# Patient Record
Sex: Female | Born: 1970 | Race: Black or African American | Hispanic: No | Marital: Single
Health system: Midwestern US, Community
[De-identification: ages and names within clinical notes are randomized; demographics above are authoritative.]

## PROBLEM LIST (undated history)

## (undated) DIAGNOSIS — E119 Type 2 diabetes mellitus without complications: Secondary | ICD-10-CM

## (undated) DIAGNOSIS — F32A Depression, unspecified: Secondary | ICD-10-CM

## (undated) DIAGNOSIS — I10 Essential (primary) hypertension: Secondary | ICD-10-CM

## (undated) DIAGNOSIS — F329 Major depressive disorder, single episode, unspecified: Secondary | ICD-10-CM

## (undated) DIAGNOSIS — F419 Anxiety disorder, unspecified: Secondary | ICD-10-CM

## (undated) HISTORY — PX: TONSILLECTOMY: SUR1361

---

## 2016-03-13 ENCOUNTER — Emergency Department (HOSPITAL_BASED_OUTPATIENT_CLINIC_OR_DEPARTMENT_OTHER)
Admission: EM | Admit: 2016-03-13 | Discharge: 2016-03-13 | Disposition: A | Discharge status: Home / Self Care | Payer: Medicaid - Out of State | Attending: Emergency Medicine | Admitting: Emergency Medicine

## 2016-03-13 ENCOUNTER — Emergency Department (HOSPITAL_BASED_OUTPATIENT_CLINIC_OR_DEPARTMENT_OTHER): Payer: Medicaid - Out of State

## 2016-03-13 ENCOUNTER — Encounter (HOSPITAL_BASED_OUTPATIENT_CLINIC_OR_DEPARTMENT_OTHER): Payer: Self-pay | Admitting: Emergency Medicine

## 2016-03-13 DIAGNOSIS — Y9389 Activity, other specified: Secondary | ICD-10-CM | POA: Insufficient documentation

## 2016-03-13 DIAGNOSIS — Z79899 Other long term (current) drug therapy: Secondary | ICD-10-CM | POA: Insufficient documentation

## 2016-03-13 DIAGNOSIS — Z7982 Long term (current) use of aspirin: Secondary | ICD-10-CM | POA: Diagnosis not present

## 2016-03-13 DIAGNOSIS — S8992XA Unspecified injury of left lower leg, initial encounter: Secondary | ICD-10-CM | POA: Diagnosis present

## 2016-03-13 DIAGNOSIS — Y998 Other external cause status: Secondary | ICD-10-CM | POA: Diagnosis not present

## 2016-03-13 DIAGNOSIS — Z7984 Long term (current) use of oral hypoglycemic drugs: Secondary | ICD-10-CM | POA: Insufficient documentation

## 2016-03-13 DIAGNOSIS — S3992XA Unspecified injury of lower back, initial encounter: Secondary | ICD-10-CM | POA: Insufficient documentation

## 2016-03-13 DIAGNOSIS — F419 Anxiety disorder, unspecified: Secondary | ICD-10-CM | POA: Insufficient documentation

## 2016-03-13 DIAGNOSIS — F329 Major depressive disorder, single episode, unspecified: Secondary | ICD-10-CM | POA: Diagnosis not present

## 2016-03-13 DIAGNOSIS — Y92410 Unspecified street and highway as the place of occurrence of the external cause: Secondary | ICD-10-CM | POA: Diagnosis not present

## 2016-03-13 DIAGNOSIS — E119 Type 2 diabetes mellitus without complications: Secondary | ICD-10-CM | POA: Diagnosis not present

## 2016-03-13 DIAGNOSIS — Z794 Long term (current) use of insulin: Secondary | ICD-10-CM | POA: Insufficient documentation

## 2016-03-13 DIAGNOSIS — S83012A Lateral subluxation of left patella, initial encounter: Secondary | ICD-10-CM | POA: Insufficient documentation

## 2016-03-13 DIAGNOSIS — I10 Essential (primary) hypertension: Secondary | ICD-10-CM | POA: Diagnosis not present

## 2016-03-13 DIAGNOSIS — S29002A Unspecified injury of muscle and tendon of back wall of thorax, initial encounter: Secondary | ICD-10-CM | POA: Diagnosis not present

## 2016-03-13 HISTORY — DX: Essential (primary) hypertension: I10

## 2016-03-13 HISTORY — DX: Anxiety disorder, unspecified: F41.9

## 2016-03-13 HISTORY — DX: Depression, unspecified: F32.A

## 2016-03-13 HISTORY — DX: Major depressive disorder, single episode, unspecified: F32.9

## 2016-03-13 HISTORY — DX: Type 2 diabetes mellitus without complications: E11.9

## 2016-03-13 LAB — PREGNANCY, URINE: PREG TEST UR: NEGATIVE

## 2016-03-13 MED ORDER — IBUPROFEN 800 MG PO TABS
800.0000 mg | ORAL_TABLET | Freq: Once | ORAL | Status: AC
  Administered 2016-03-13: 800 mg via ORAL
  Filled 2016-03-13: qty 1

## 2016-03-13 MED ORDER — METHOCARBAMOL 500 MG PO TABS: 500.0000 mg | ORAL_TABLET | Freq: Two times a day (BID) | ORAL | Status: AC

## 2016-03-13 MED ORDER — TRAMADOL HCL 50 MG PO TABS: 50.0000 mg | ORAL_TABLET | Freq: Four times a day (QID) | ORAL | Status: AC | PRN
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
## 2016-03-13 NOTE — ED Notes (Signed)
Pt ambulated in hall well, she states that she had relief with the knee sleeve and able to ambulate now much better than when she first arrived in the ED.
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
## 2016-09-08 ENCOUNTER — Inpatient Hospital Stay
Admit: 2016-09-08 | Discharge: 2016-09-08 | Disposition: A | Discharge status: Home / Self Care | Payer: PRIVATE HEALTH INSURANCE | Attending: Emergency Medicine

## 2016-09-08 DIAGNOSIS — M5430 Sciatica, unspecified side: Secondary | ICD-10-CM

## 2016-09-08 MED ORDER — MORPHINE 4 MG/ML SYRINGE
4 mg/mL | INTRAMUSCULAR | Status: DC
Start: 2016-09-08 — End: 2016-09-08

## 2016-09-08 MED ORDER — PREDNISONE 20 MG TAB
20 mg | ORAL | Status: AC
Start: 2016-09-08 — End: 2016-09-08
  Administered 2016-09-08: 22:00:00 via ORAL

## 2016-09-08 MED ORDER — ACETAMINOPHEN-CODEINE 300 MG-30 MG TAB
300-30 mg | ORAL_TABLET | Freq: Four times a day (QID) | ORAL | 0 refills | Status: AC | PRN
Start: 2016-09-08 — End: ?

## 2016-09-08 MED ORDER — CYCLOBENZAPRINE 10 MG TAB
10 mg | ORAL | Status: AC
Start: 2016-09-08 — End: 2016-09-08
  Administered 2016-09-08: 22:00:00 via ORAL

## 2016-09-08 MED ORDER — ACETAMINOPHEN-CODEINE 300 MG-30 MG TAB
300-30 mg | ORAL | Status: AC
Start: 2016-09-08 — End: 2016-09-08
  Administered 2016-09-08: 22:00:00 via ORAL

## 2016-09-08 MED ORDER — CYCLOBENZAPRINE 10 MG TAB
10 mg | ORAL_TABLET | Freq: Three times a day (TID) | ORAL | 0 refills | Status: AC
Start: 2016-09-08 — End: ?

## 2016-09-08 MED FILL — ACETAMINOPHEN-CODEINE 300 MG-30 MG TAB: 300-30 mg | ORAL | Qty: 1

## 2016-09-08 MED FILL — PREDNISONE 20 MG TAB: 20 mg | ORAL | Qty: 3

## 2016-09-08 MED FILL — CYCLOBENZAPRINE 10 MG TAB: 10 mg | ORAL | Qty: 1

## 2016-09-08 NOTE — ED Notes (Signed)
Pt feels better ambulated steadily

## 2016-09-08 NOTE — ED Notes (Signed)
I have reviewed discharge instructions with the patient.  The parent verbalized understanding.

## 2016-09-08 NOTE — ED Triage Notes (Signed)
Pt c/o lower back pain radiates to B legs and feet x 1 day. Pt c/o heel spur in R foot.
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
## 2016-09-08 NOTE — ED Provider Notes (Signed)
HPI Comments: Krystal Hebert is a 45 y.o. female with hx of DM and HTN who presents to the ED with complaint of lower back pain that shoots to her bilateral legs and feet. Her pain began 1 day ago and she describes it as shooting.  No recent injury or trauma. She has never been diagnosed with sciatica before. No fever, chills, neck pain, HA, abnormal gait. She is compliant with her home medications      Patient is a 45 y.o. female presenting with back pain. The history is provided by the patient.   Back Pain    This is a new problem. The current episode started yesterday. The problem has not changed since onset.The problem occurs hourly. Patient reports not work related injury.The pain is associated with no known injury. The pain is present in the lower back. The quality of the pain is described as shooting. The pain radiates to the left foot and right foot. The pain is moderate. The symptoms are aggravated by bending. Pertinent negatives include no chest pain, no fever, no numbness, no weight loss, no headaches, no abdominal pain, no abdominal swelling, no bowel incontinence, no perianal numbness, no bladder incontinence, no dysuria, no pelvic pain, no leg pain, no paresthesias, no paresis, no tingling and no weakness. She has tried nothing for the symptoms.        Past Medical History:   Diagnosis Date   ??? Anemia    ??? Diabetes (HCC)    ??? Hypertension    ??? Peri-rectal abscess        Past Surgical History:   Procedure Laterality Date   ??? HX OTHER SURGICAL      peri rectal abcsess repair 2017         History reviewed. No pertinent family history.    Social History     Social History   ??? Marital status: SINGLE     Spouse name: N/A   ??? Number of children: N/A   ??? Years of education: N/A     Occupational History   ??? Not on file.     Social History Main Topics   ??? Smoking status: Never Smoker   ??? Smokeless tobacco: Never Used   ??? Alcohol use No   ??? Drug use: No   ??? Sexual activity: Not on file     Other Topics Concern    ??? Not on file     Social History Narrative   ??? No narrative on file         ALLERGIES: Review of patient's allergies indicates no known allergies.    Review of Systems   Constitutional: Negative for chills, fever and weight loss.   HENT: Negative for congestion, rhinorrhea and sore throat.    Respiratory: Negative for chest tightness and shortness of breath.    Cardiovascular: Negative for chest pain and palpitations.   Gastrointestinal: Negative for abdominal pain, bowel incontinence, diarrhea, nausea and vomiting.   Genitourinary: Negative for bladder incontinence, difficulty urinating, dysuria and pelvic pain.   Musculoskeletal: Positive for back pain (lower back) and myalgias (leg/feet). Negative for neck pain and neck stiffness.   Skin: Negative.    Neurological: Negative.  Negative for tingling, weakness, numbness, headaches and paresthesias.   Psychiatric/Behavioral: Negative.        Vitals:    09/08/16 1702   BP: 160/82   Pulse: (!) 18   Resp: 18   Temp: 97.7 ??F (36.5 ??C)   SpO2: 100%   Weight:  127 kg (280 lb)   Height: 5\' 4"  (1.626 m)            Physical Exam   Constitutional: She is oriented to person, place, and time. She appears well-developed and well-nourished. No distress.   HENT:   Head: Normocephalic and atraumatic.   Eyes: Conjunctivae are normal. Pupils are equal, round, and reactive to light. Right eye exhibits no discharge. Left eye exhibits no discharge. No scleral icterus.   Neck: Neck supple.   Cardiovascular: Normal heart sounds and intact distal pulses.  Exam reveals no gallop and no friction rub.    No murmur heard.  Pulmonary/Chest: Effort normal and breath sounds normal. No respiratory distress. She has no wheezes. She has no rales. She exhibits no tenderness.   Abdominal: Soft. She exhibits no distension. There is no tenderness.   Musculoskeletal: She exhibits tenderness (lumbar muscles).        Lumbar back: She exhibits tenderness. She exhibits normal range of  motion, no bony tenderness, no deformity and no laceration.   bilateral lumbar sacral pain, burning radiation to buttocks and lower extremity   Neurological: She is alert and oriented to person, place, and time.   Skin: Skin is warm and dry. She is not diaphoretic.   Psychiatric: She has a normal mood and affect. Her behavior is normal.   Nursing note and vitals reviewed.       MDM  Number of Diagnoses or Management Options  Diagnosis management comments: 45 y.o. female with lumbar/sacral tenderness and pain that shoots down her bilateral legs to her feet. Likely sciatica.       Plan:     Flexeril  Tylenol #3  Prednisone  Discharge  -----  5:32 PM  Documented by Boris LowneEnna Wedding, acting as a scribe for Krystal MayansMelvin Cheyenne Schumm, NP    PROVIDER ATTESTATION:  6:48 PM    The entirety of this note, signed by me, accurately reflects all works, treatments, procedures, and medical decision making performed by me, Krystal MayansMelvin Lanijah Warzecha, NP.            Patient Progress  Patient progress: stable    ED Course       Procedures
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
